# Patient Record
Sex: Male | Born: 1940 | Race: White
Health system: Northeastern US, Academic
[De-identification: ages and names within clinical notes are randomized; demographics above are authoritative.]

## PROBLEM LIST (undated history)

## (undated) NOTE — Progress Notes (Signed)
Formatting of this note might be different from the original.    HPI    ROS    Physical Exam    d  Electronically signed by Kurtis Bushman, DO at 01/20/2013  9:28 PM EDT

---

## 2005-06-05 ENCOUNTER — Encounter: Payer: Self-pay | Admitting: Cardiology

## 2005-06-05 ENCOUNTER — Other Ambulatory Visit: Payer: Self-pay

## 2005-07-02 ENCOUNTER — Encounter: Payer: Self-pay | Admitting: Cardiology

## 2005-07-03 ENCOUNTER — Encounter: Payer: Self-pay | Admitting: Cardiology

## 2005-07-17 ENCOUNTER — Other Ambulatory Visit: Payer: Self-pay

## 2005-07-17 ENCOUNTER — Encounter: Payer: Self-pay | Admitting: Cardiology

## 2005-08-14 ENCOUNTER — Encounter: Payer: Self-pay | Admitting: Cardiology

## 2005-10-02 ENCOUNTER — Encounter: Payer: Self-pay | Admitting: Cardiology

## 2005-10-02 ENCOUNTER — Other Ambulatory Visit: Payer: Self-pay

## 2006-04-10 DIAGNOSIS — R5381 Other malaise: Secondary | ICD-10-CM | POA: Insufficient documentation

## 2006-04-10 DIAGNOSIS — K219 Gastro-esophageal reflux disease without esophagitis: Secondary | ICD-10-CM | POA: Insufficient documentation

## 2006-04-10 DIAGNOSIS — I4891 Unspecified atrial fibrillation: Secondary | ICD-10-CM | POA: Insufficient documentation

## 2006-04-10 DIAGNOSIS — I10 Essential (primary) hypertension: Secondary | ICD-10-CM | POA: Insufficient documentation

## 2006-04-10 DIAGNOSIS — R5383 Other fatigue: Secondary | ICD-10-CM | POA: Insufficient documentation

## 2006-04-16 ENCOUNTER — Encounter: Payer: Self-pay | Admitting: Cardiology

## 2006-04-16 ENCOUNTER — Other Ambulatory Visit: Payer: Self-pay

## 2007-04-15 ENCOUNTER — Other Ambulatory Visit: Payer: Self-pay

## 2007-04-15 ENCOUNTER — Encounter: Payer: Self-pay | Admitting: Cardiology

## 2011-08-07 IMAGING — DX ABDOMEN 1 VIEW
1 series · 1 of 1 positions shown · non-contrast
Comparison: none

KUB, 08/07/11:
INDICATION: History of right nephrolithiasis.

[AP]
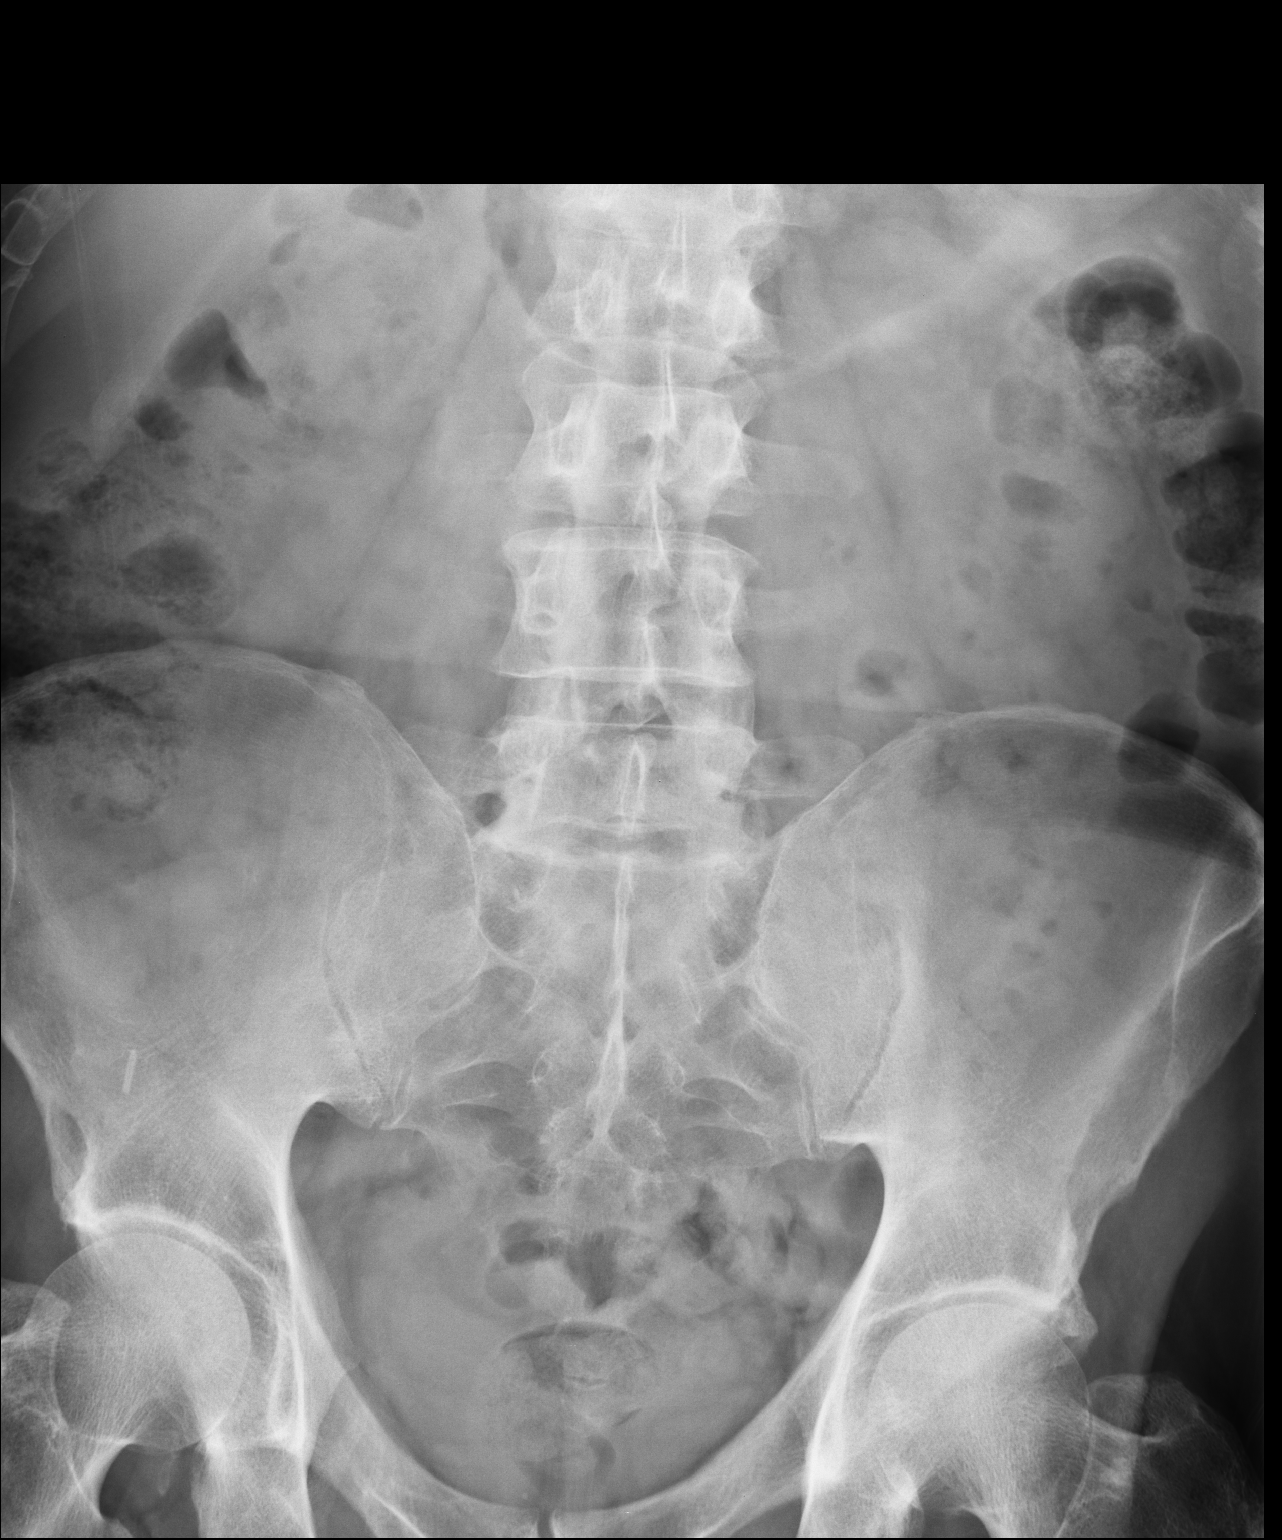

[1 of 1 positions shown; findings below may reference images not displayed]

FINDINGS: There are surgical clips overlying the right upper quadrant and
the right lower quadrants of the abdomen, most consistent with a previous
cholecystectomy and appendectomy.  No evidence of calcifications overlying
the kidneys, ureters, or bladder are identified.  No evidence of an acute
bone or soft tissue abnormality is apparent.
IMPRESSION: Probable prior cholecystectomy and probable previous appendectomy.  No
nephroureterolithiasis or other urinary abnormality is detected on this
plain film exam.

## 2012-09-02 IMAGING — DX ABDOMEN 1 VIEW
1 series · 1 of 1 positions shown · non-contrast
Comparison: none

ABDOMEN, 09/02/12:
A supine view of the abdomen from 09/02/12 was obtained in this 71-year-old
with gross hematuria.  Comparison is made to a film from 08/07/11.
There are surgical clips from a cholecystectomy.  No abnormal
calcifications seen in either kidney or ureter.  There is a surgical clip
in the right lower quadrant probably from appendectomy.  Mild degenerative
changes are seen in both hips and in the lumbar spine.

[AP]
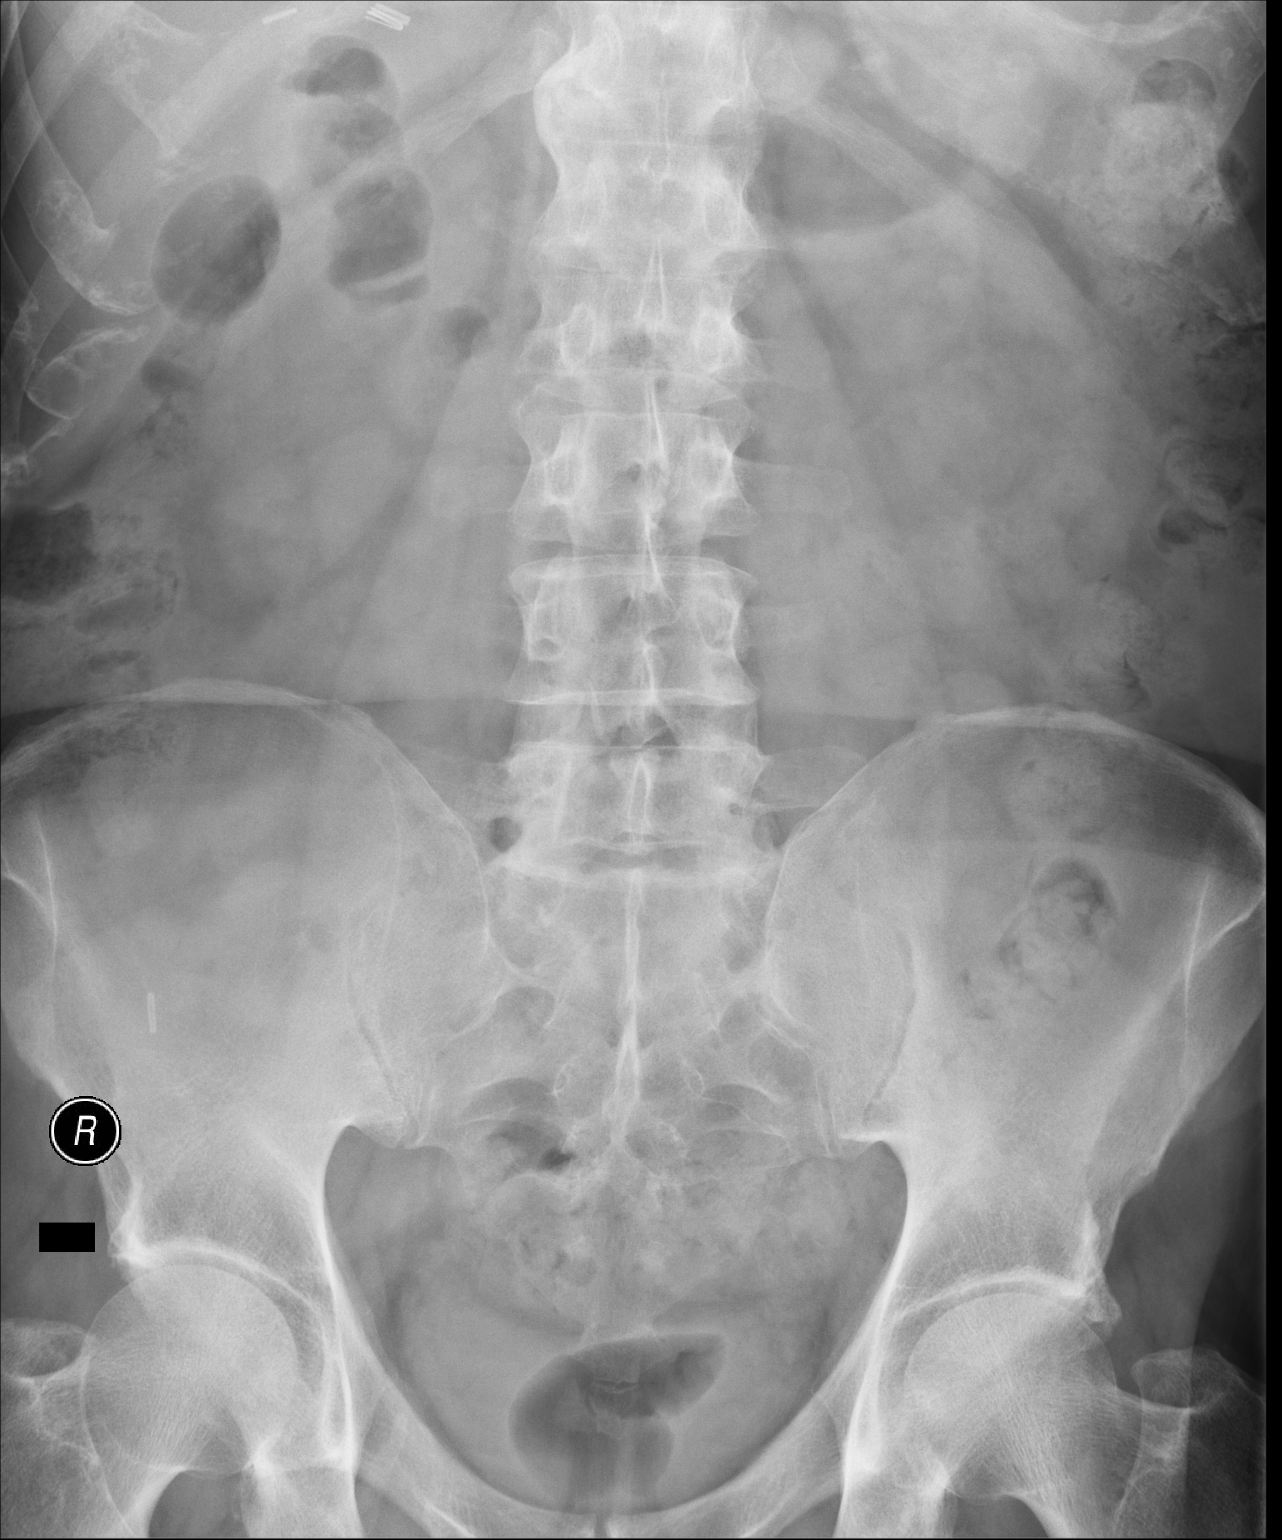

[1 of 1 positions shown; findings below may reference images not displayed]

IMPRESSION: No calcified renal stone identified.

## 2020-11-22 IMAGING — CT CT ENTEROGRAPHY WITH CONTRAST
2 series · 14 of 36 positions shown, 18 images · IV contrast (ISOVUE 300)
Comparison: None

CT ENTEROGRAPHY WITH CONTRAST, 11/22/2020 [DATE]: 
CLINICAL INDICATION:  Diarrhea and cramping. Low abdominal pain. Radiates to 
back for 3 months. Previous cholecystectomy. 
The patient's eGFR was calculated to be 64.2 using the i-STAT device. 
A search for DICOM formatted images was conducted for prior CT imaging studies 
completed at a non-affiliated media free facility.
TECHNIQUE: The region of interest was scanned with contrast on a high 
resolution low dose CT scanner.  100 mL of Isovue 300 were injected 
intravenously. The patient also drank approximately 9222 mL of oral contrast. 1 
mg glucagon was injected intravenously.  Routine MPR reconstructions were 
performed.

[Series 4: abd/pel ax w · axial · 0.79mm/px · z∈[-480,-18]mm · 13 of 171 slices shown, 16 images]
[im 11/171  soft-tissue]
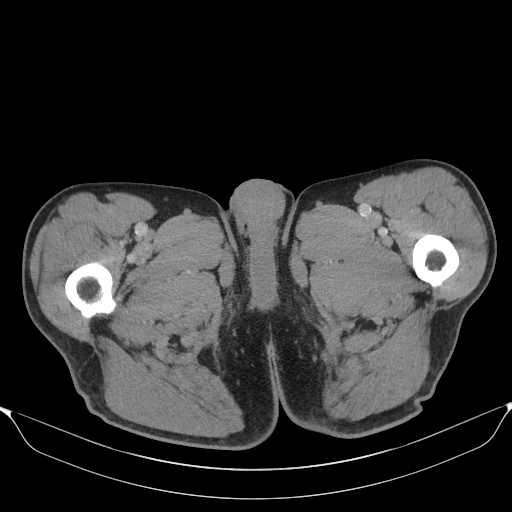
[im 11/171  bone]
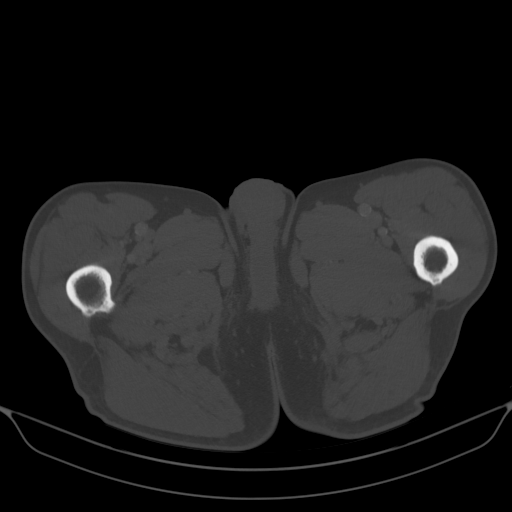
[im 28/171  soft-tissue]
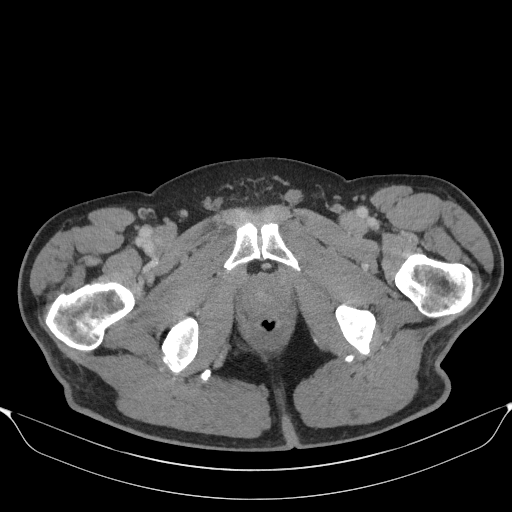
[im 44/171  soft-tissue]
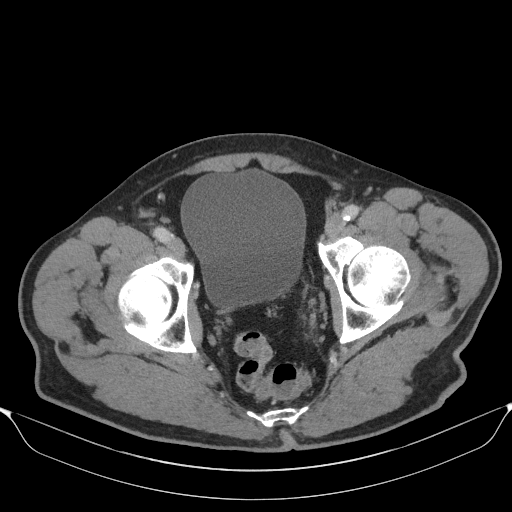
[im 61/171  soft-tissue]
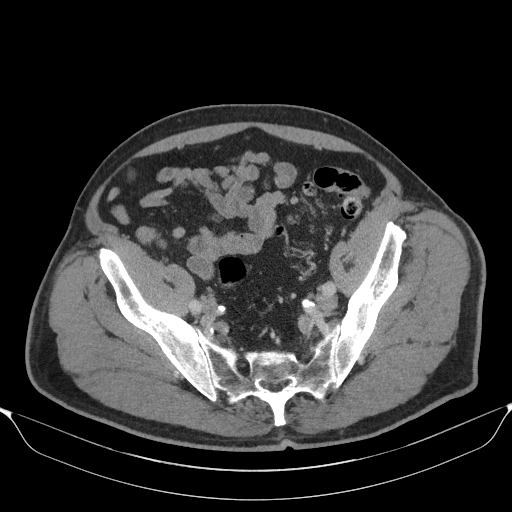
[im 77/171  soft-tissue]
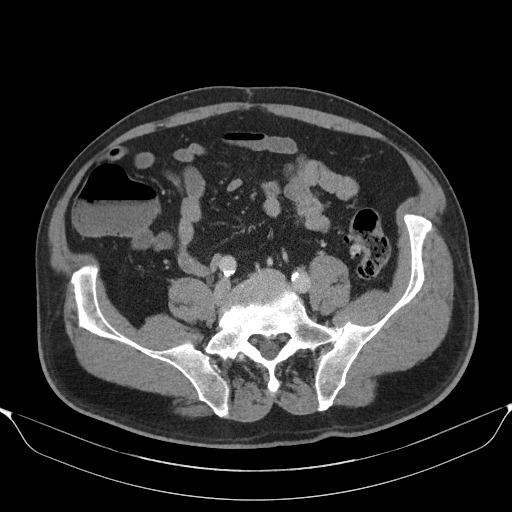
[im 94/171  soft-tissue]
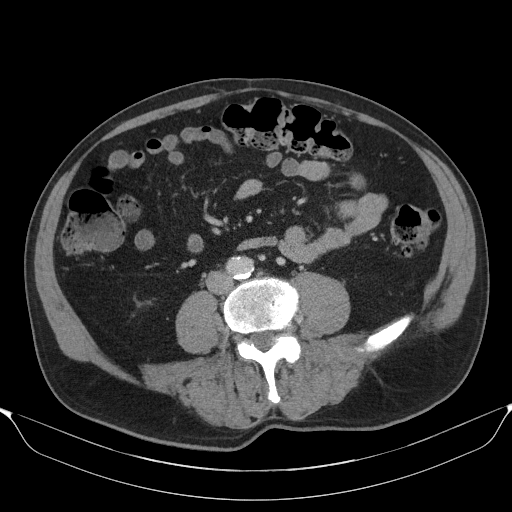
[im 110/171  soft-tissue]
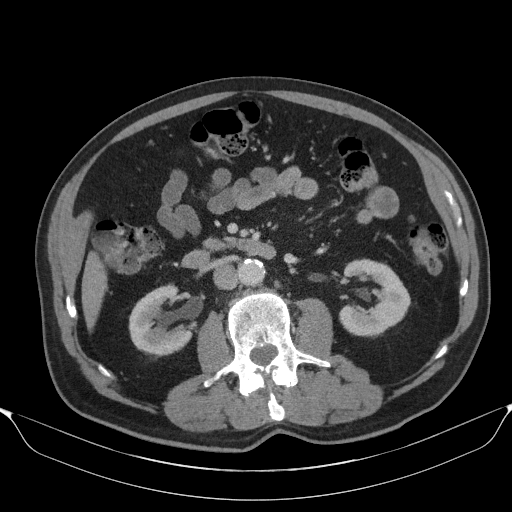
[im 127/171  soft-tissue]
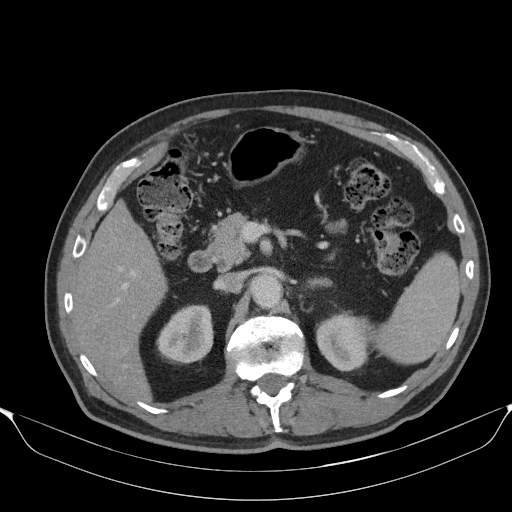
[im 143/171  soft-tissue]
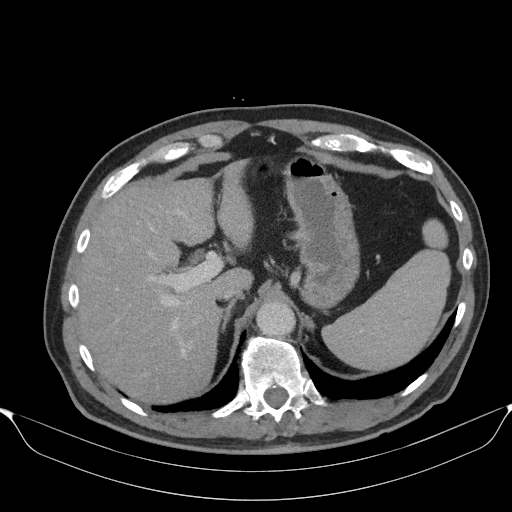
[im 143/171  bone]
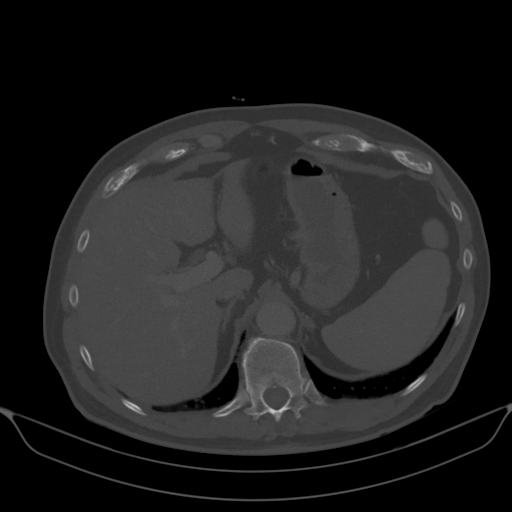
[im 149/171  lung]
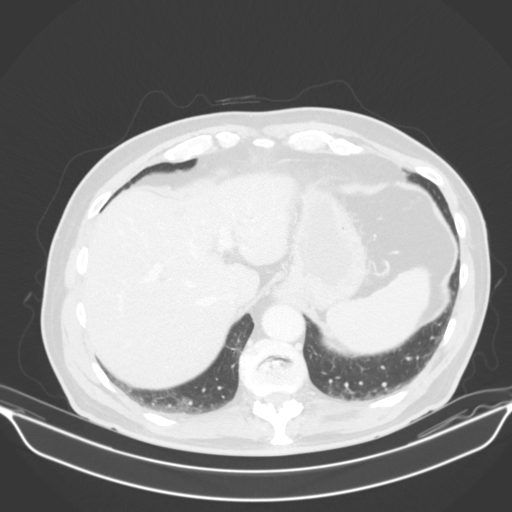
[im 154/171  lung]
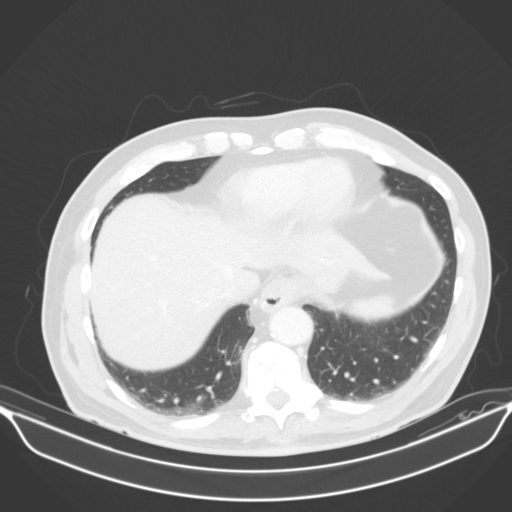
[im 160/171  soft-tissue]
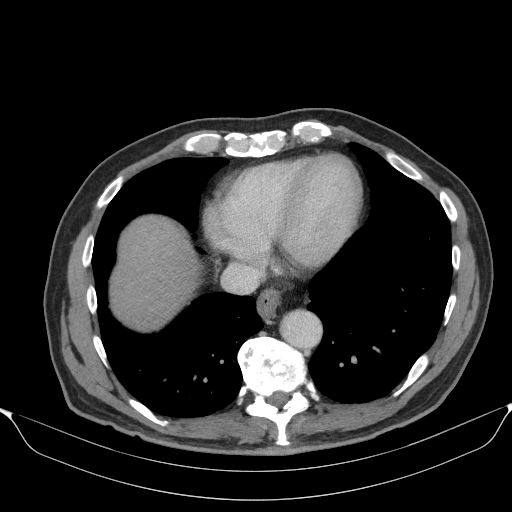
[im 160/171  lung]
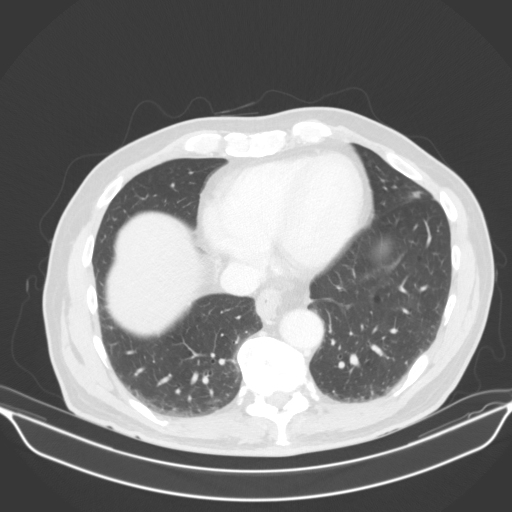
[im 165/171  lung]
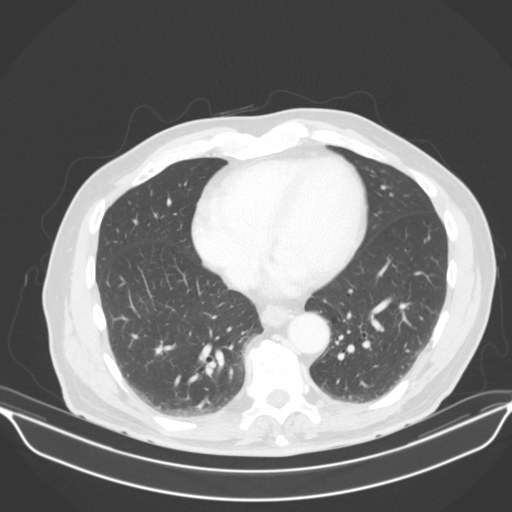

[Series 6: abd/pel sag w · sagittal · 0.70mm/px · 1 of 126 slices shown, 2 images]
[im 42/126  soft-tissue]
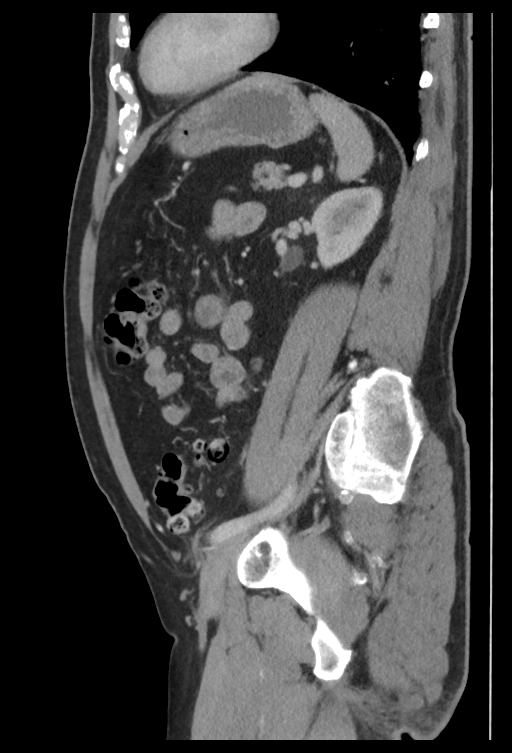
[im 42/126  bone]
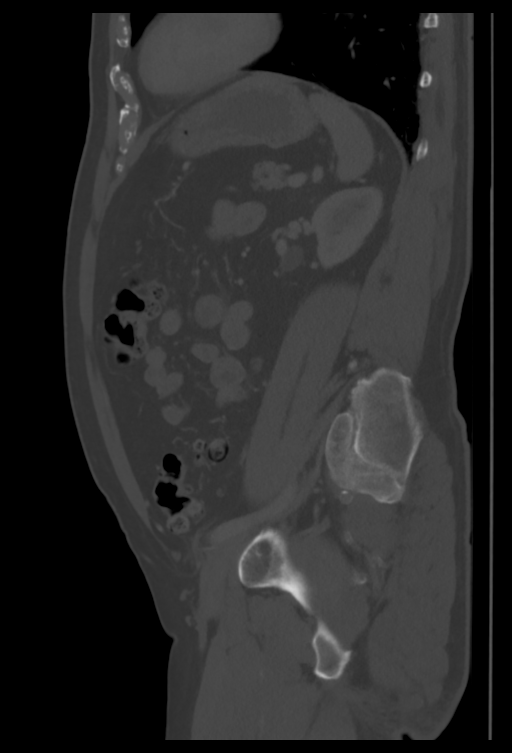

[14 of 36 positions shown; findings below may reference images not displayed]

FINDINGS: LUNG BASES: Lung bases are clear. No pleural effusions. 
HEPATOBILIARY: No mass or biliary dilatation. Previous cholecystectomy. 
SPLEEN: Normal in size. 
PANCREAS: No ductal dilatation or mass. 
ADRENALS: No mass. 
GENITOURINARY: No enhancing mass or hydronephrosis.  Bladder is unremarkable. 
LYMPH NODES: No adenopathy. 
STOMACH, SMALL BOWEL AND COLON: No bowel wall thickening or obstruction. 
VASCULAR STRUCTURES: Atherosclerotic changes without aneurysmal dilatation. 
MUSCULOSKELETAL: No acute osseous abnormality. Scattered degenerative changes. 
ADDITIONAL FINDINGS: Enlarged prostate gland.
IMPRESSION: No acute abnormality seen to explain the patient's change in bowel habits. No 
mass or acute abnormality seen within the colon. 
RADIATION DOSE REDUCTION: All CT scans are performed using radiation dose 
reduction techniques, when applicable.  Technical factors are evaluated and 
adjusted to ensure appropriate moderation of exposure.  Automated dose 
management technology is applied to adjust the radiation doses to minimize 
exposure while achieving diagnostic quality images.

## 2021-05-23 IMAGING — CT CT CHEST WITH CONTRAST
2 of 4 series · 15 of 36 positions shown, 18 images · IV contrast (ISOVUE 300)
Comparison: Comparison was made to the prior exam(s) within the last 12 months 
CT scan of the abdomen and pelvis from November 2020.

________________________________________________________________________________________________ 
CT CHEST WITH CONTRAST, 05/23/2021 [DATE]: 
CLINICAL INDICATION: Elevated tumor markers. Previous cholecystectomy. 
A search for DICOM formatted images was conducted for prior CT imaging studies 
completed at a non-affiliated media free facility.
TECHNIQUE: The chest was scanned from base of neck through the lung bases with 
100 mL of Isovue 300 injected intravenously on a high resolution low dose CT 
scanner. Routine MPR and MIP 3D renderings were reconstructed on an independent 
workstation with concurrent physician supervision. The patients eGFR was 
calculated to be 71.7 mL/min/1.73 m2 using the i-STAT device.

[Series 4: chest 2.0 i31s 3 · axial · 0.90mm/px · z∈[-339,+17]mm · 12 of 196 slices shown, 15 images]
[im 9/196  mediastinal]
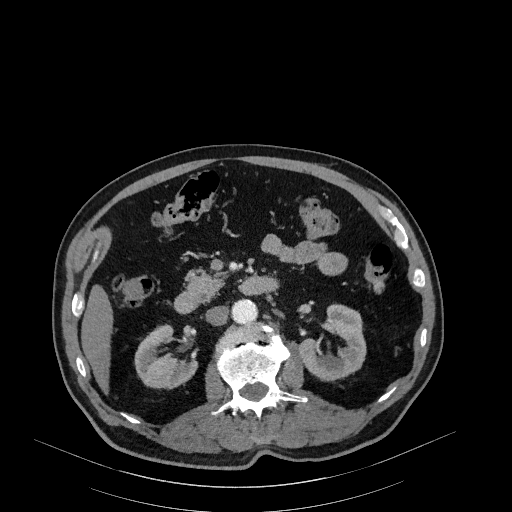
[im 9/196  lung]
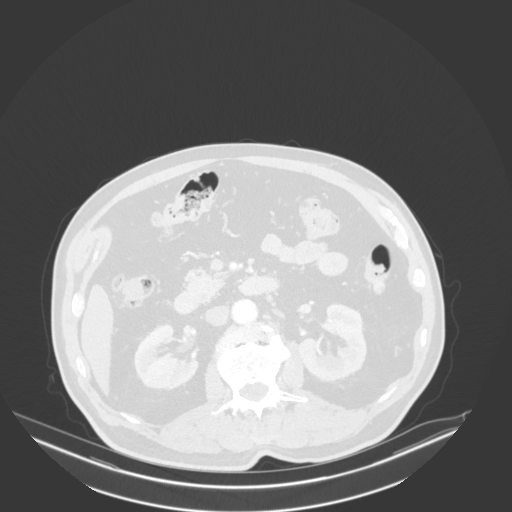
[im 27/196  lung]
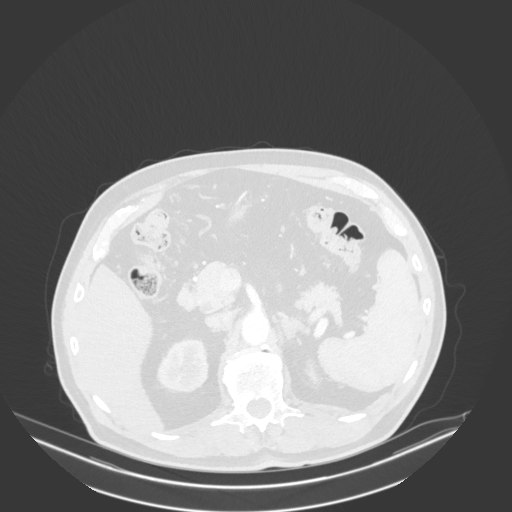
[im 45/196  lung]
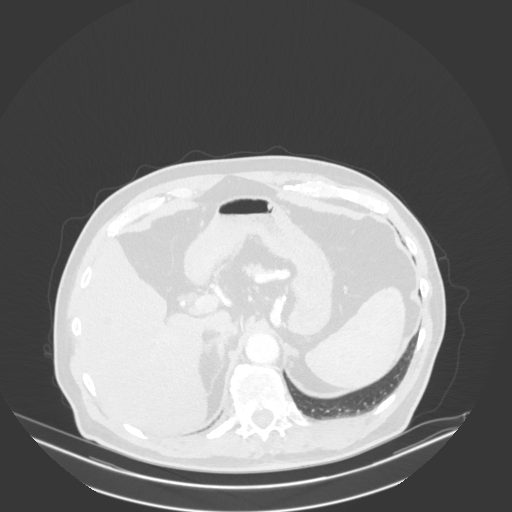
[im 63/196  lung]
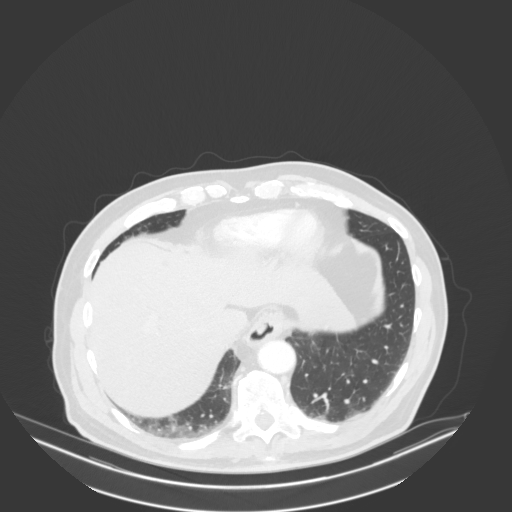
[im 71/196  mediastinal]
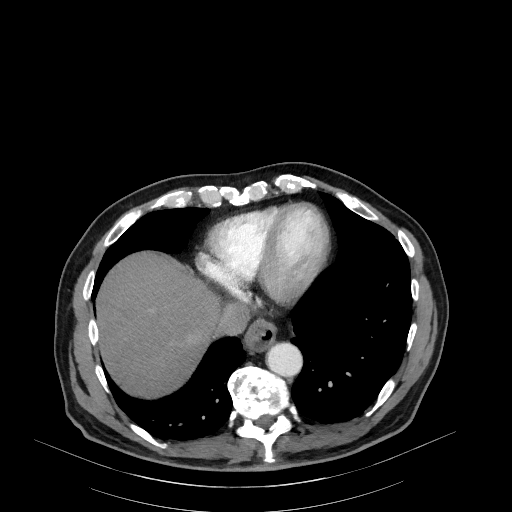
[im 71/196  lung]
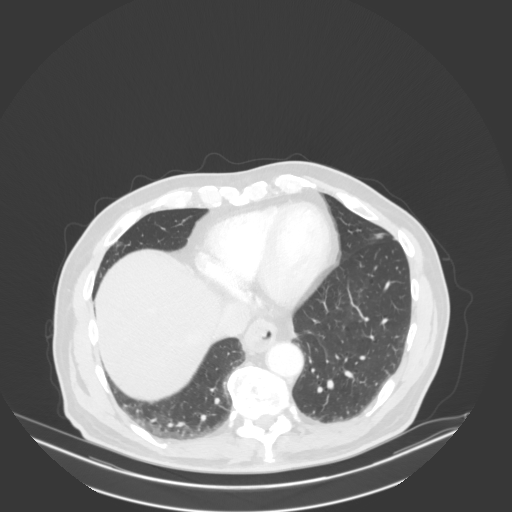
[im 89/196  lung]
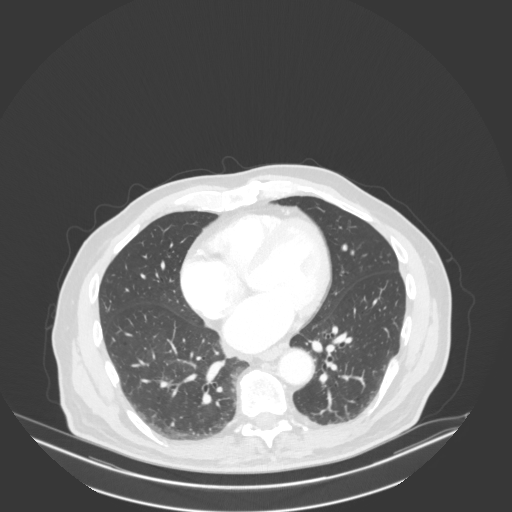
[im 107/196  lung]
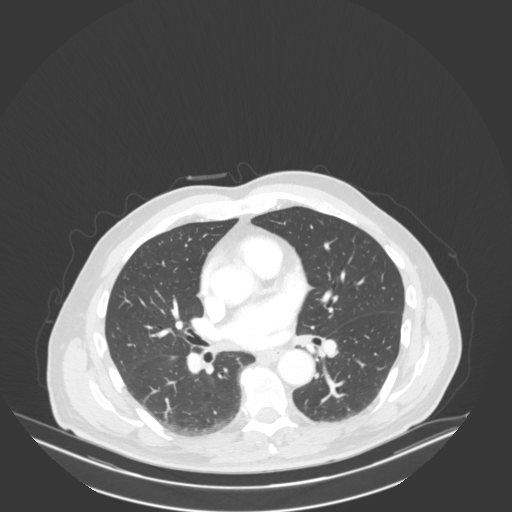
[im 125/196  lung]
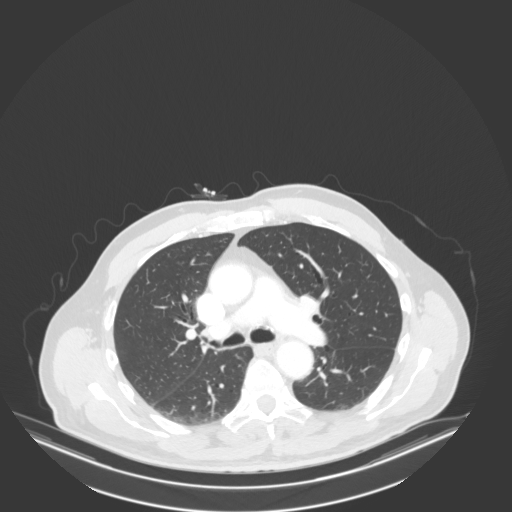
[im 133/196  mediastinal]
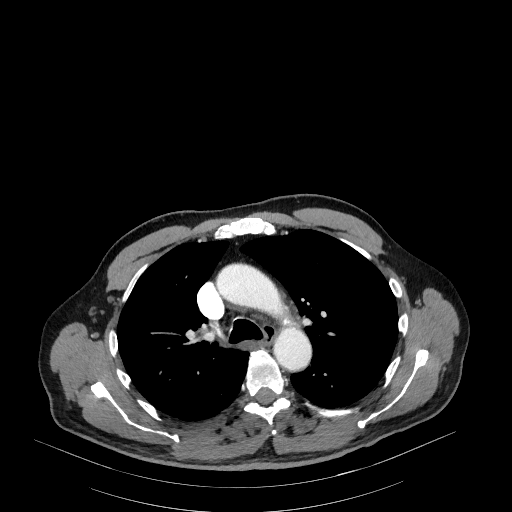
[im 133/196  lung]
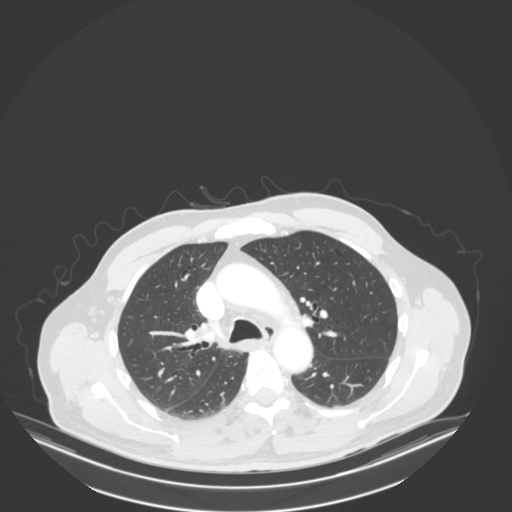
[im 151/196  lung]
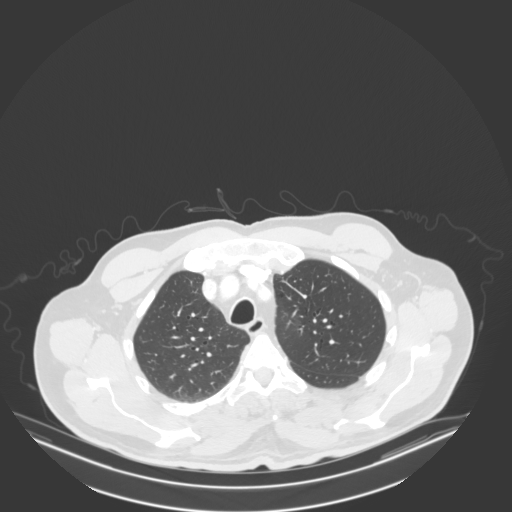
[im 169/196  lung]
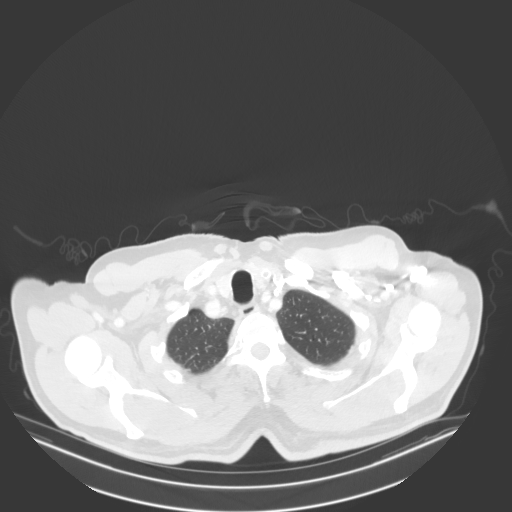
[im 187/196  lung]
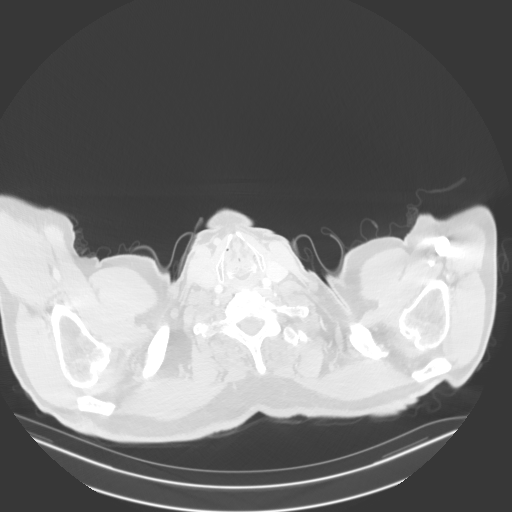

[Series 7: coronal · coronal · 0.77mm/px · 3 of 151 slices shown]
[im 31/151  lung]
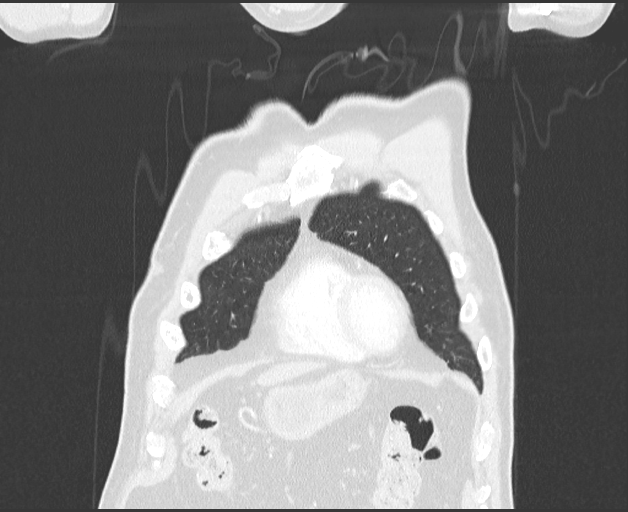
[im 61/151  lung]
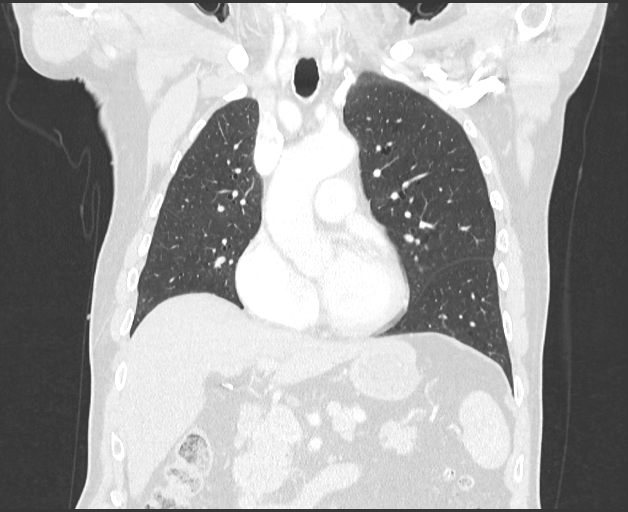
[im 91/151  lung]
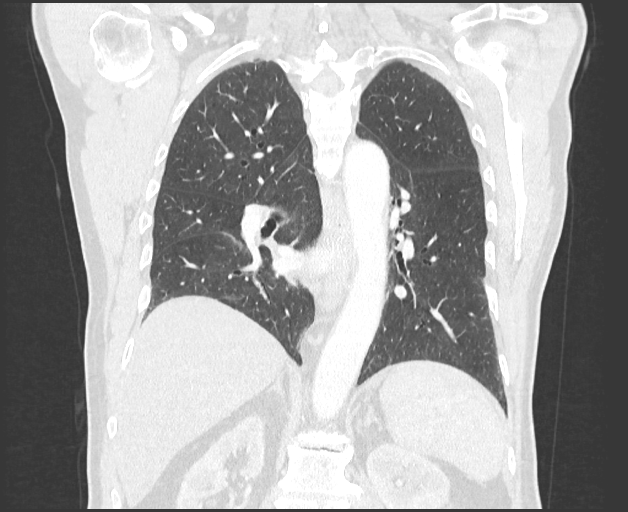

[15 of 36 positions shown; findings below may reference images not displayed]

FINDINGS: LUNGS AND PLEURA:  Mild emphysematous changes. No mass. No pleural effusion. No 
consolidation. 
MEDIASTINUM:  Atherosclerotic changes. At least mild coronary calcifications. 
CHEST WALL/AXILLA: No mass or adenopathy.  
UPPER ABDOMEN: Small hiatal hernia. 
MUSCULOSKELETAL: Degenerative changes. No acute fracture seen.
IMPRESSION: Emphysematous changes, atherosclerotic changes and degenerative changes. Small 
hiatal hernia. No mass or suspicious adenopathy identified. 
RADIATION DOSE REDUCTION: All CT scans are performed using radiation dose 
reduction techniques, when applicable.  Technical factors are evaluated and 
adjusted to ensure appropriate moderation of exposure.  Automated dose 
management technology is applied to adjust the radiation doses to minimize 
exposure while achieving diagnostic quality images.

## 2022-11-21 IMAGING — MR MRI LUMBAR SPINE W/WO CONTRAST
6 of 12 series · 12 of 48 positions shown · IV contrast (Gadolinium)
Comparison: None

________________________________________________________________________________________________ 
MRI LUMBAR SPINE W/WO CONTRAST, 11/21/2022 [DATE]: 
CLINICAL INDICATION: Low back pain. Laminectomy one month ago. Bilateral thigh 
numbness and tingling.
TECHNIQUE: Multiplanar, multiecho position MR images of the lumbar spine were 
performed without and with 8.5 mL of Gadavist were injected intravenously by 
hand. 1.5 mL was discarded. Patient was scanned on a 1.5T magnet

[Series 101: survey · axial · 10.0mm · 1.25mm/px · 1 of 10 slices shown]
[im 1/10]
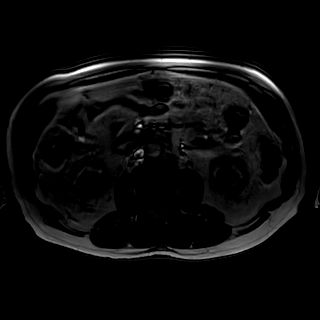

[Series 201: t2w_cor-surv · coronal · 6.0mm · 0.62mm/px · 1 of 10 slices shown]
[im 1/10]
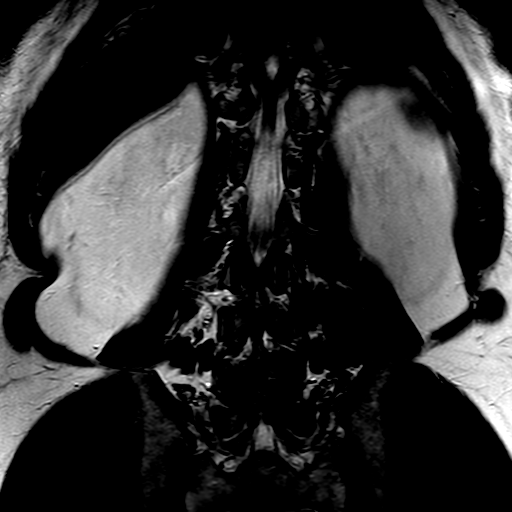

[Series 301: t1_tse_sag · sagittal · 4.0mm · 0.36mm/px · 2 of 19 slices shown]
[im 1/19]
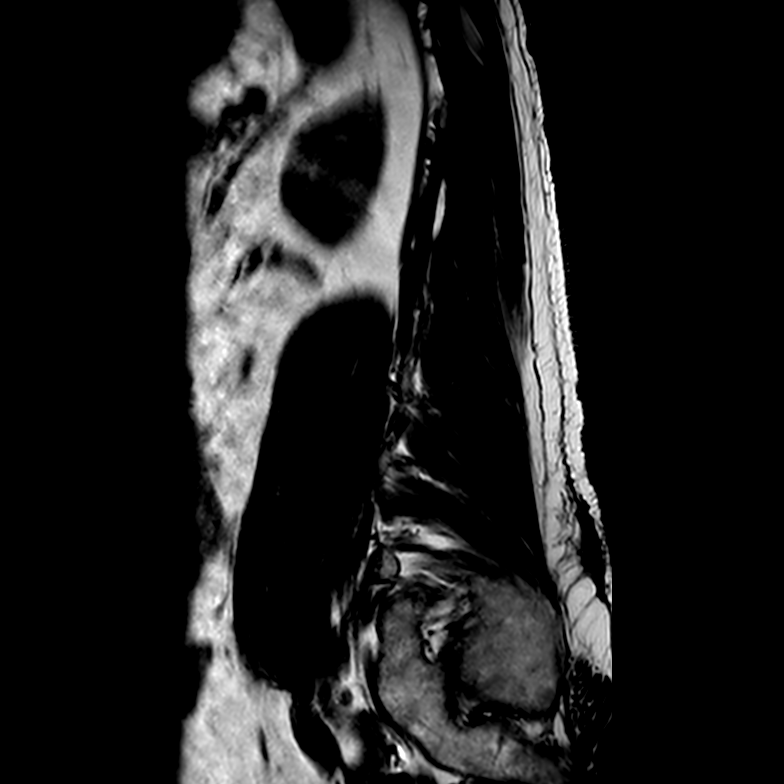
[im 19/19]
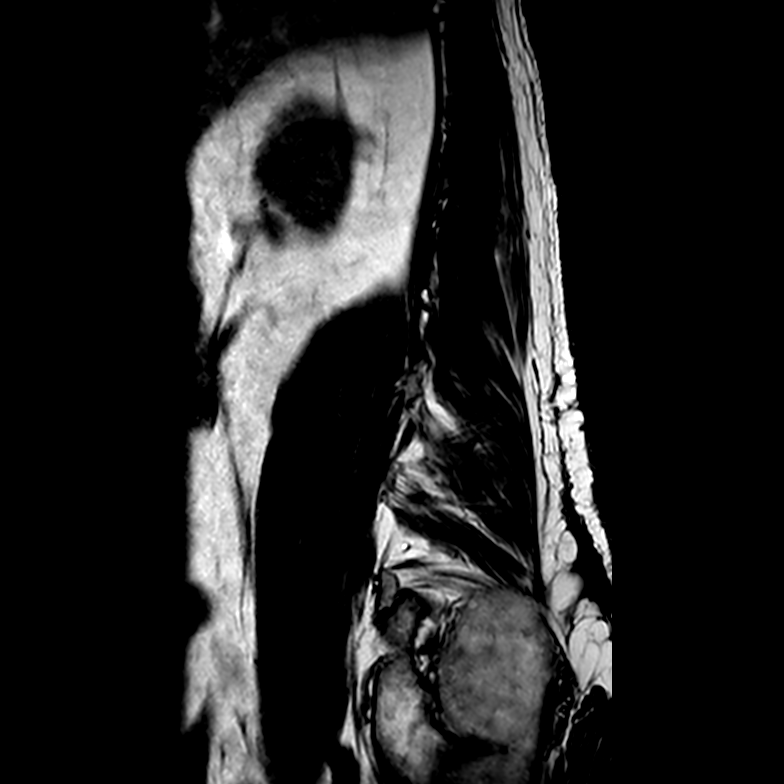

[Series 402: (id)_mdixon_tse · sagittal · 4.0mm · 0.43mm/px · 2 of 19 slices shown]
[im 1/19]
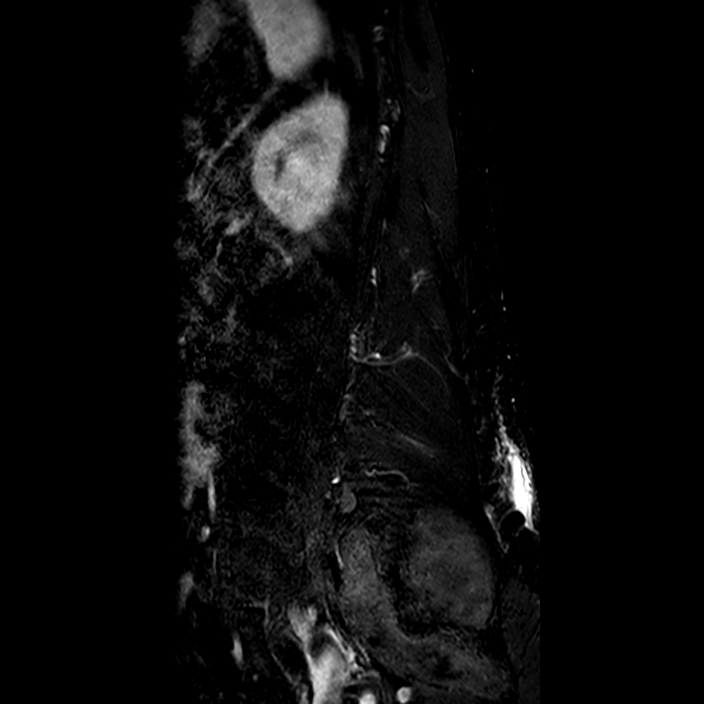
[im 19/19]
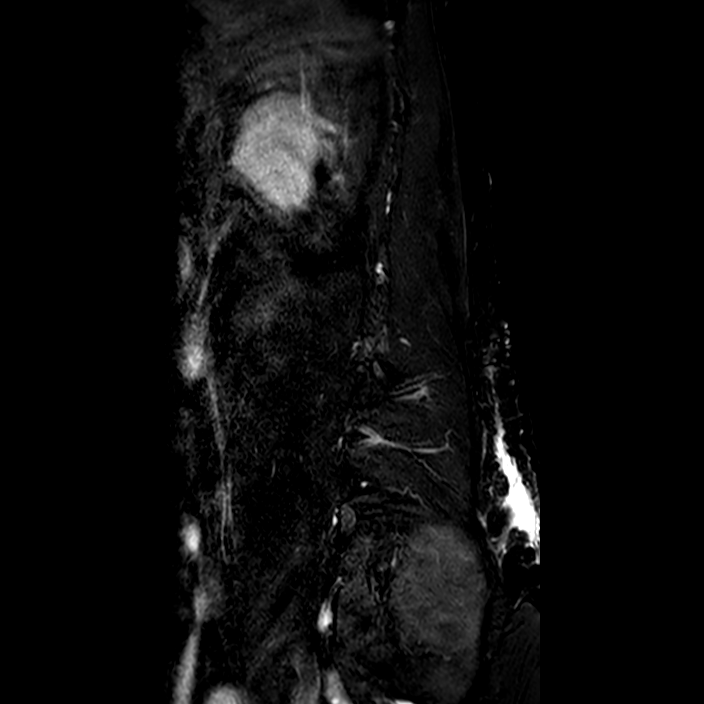

[Series 403: st2w_mdixon_tse · sagittal · 4.0mm · 0.43mm/px · 2 of 19 slices shown]
[im 1/19]
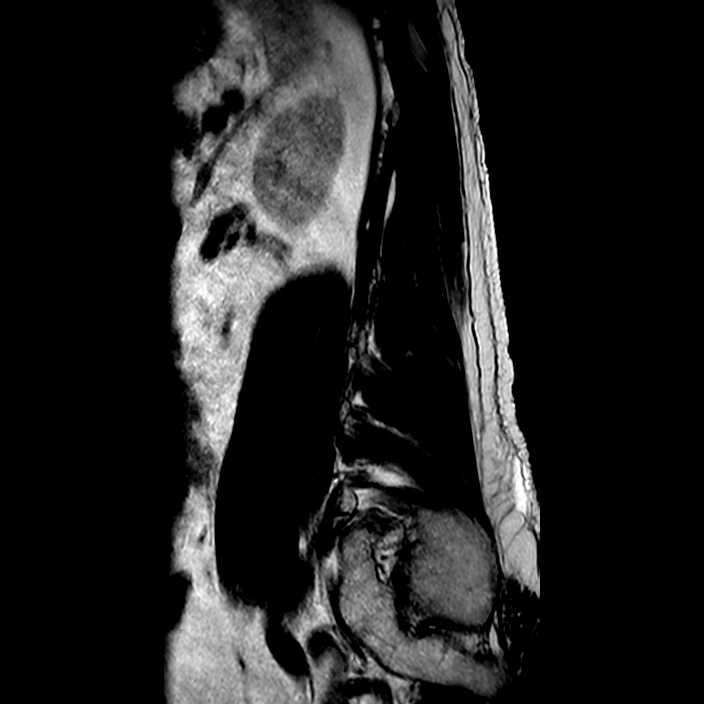
[im 19/19]
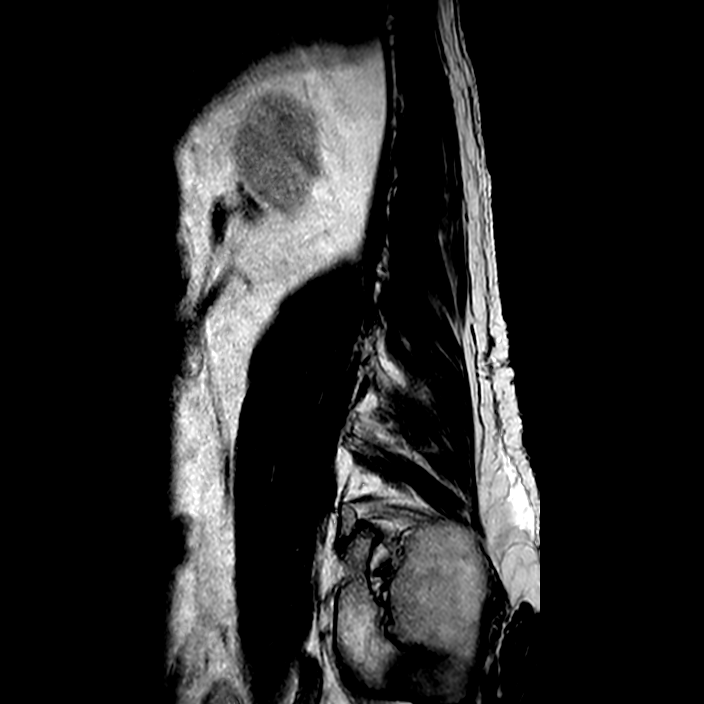

[Series 601: t2_(person_name)_(person_name)_mst · axial · 4.0mm · 0.30mm/px · z∈[-161,+77]mm · 4 of 30 slices shown]
[im 1/30]
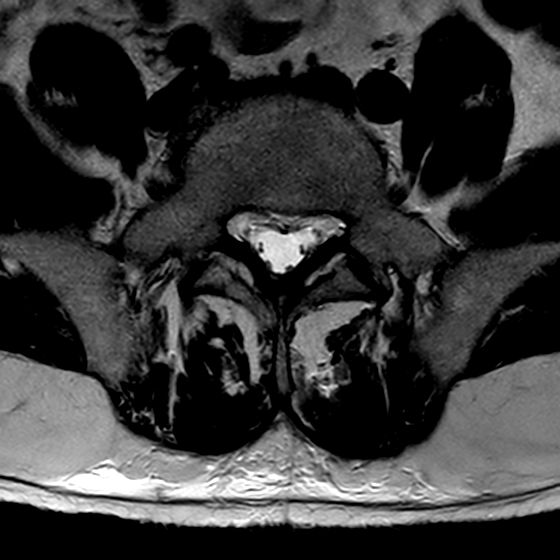
[im 10/30]
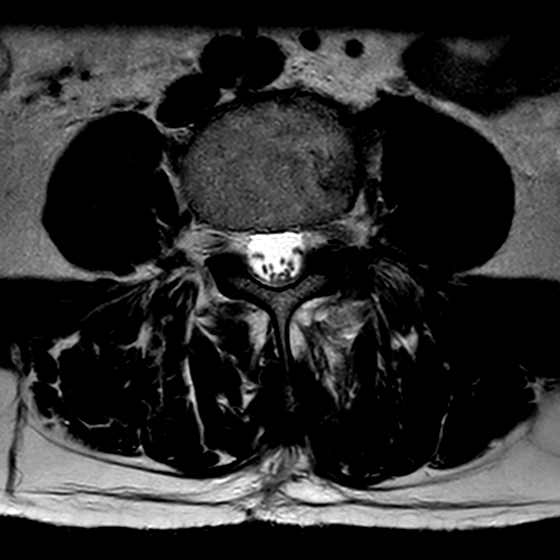
[im 20/30]
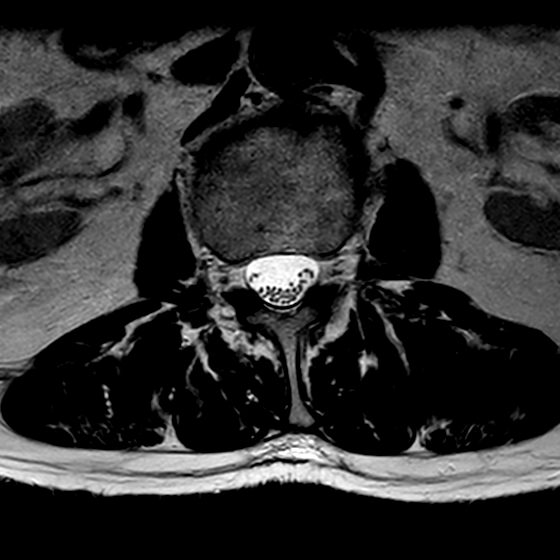
[im 30/30]
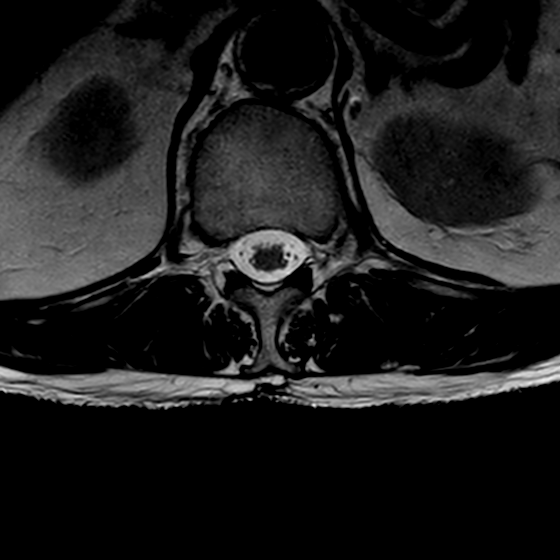

[12 of 48 positions shown; findings below may reference images not displayed]

FINDINGS: Laminectomy on the left at L4-L5. There is enhancing granulation tissue within 
the laminectomy bed but without evidence of discitis, osteomyelitis, or epidural 
abscess. There is a small fluid collection within the subcutaneous fat at the L4 
level measuring 4 x 16 mm in transverse dimension and 6 mm in craniocaudal 
dimension. It demonstrates peripheral enhancement. No communication to the 
thecal sac. 
-------------------------------------------------------------------------------- 
------ 
GENERAL: 
Nomenclature is based on 5 lumbar type vertebral bodies.     
ALIGNMENT: Mild dextroconvex lumbar scoliosis. Trace grade 1 retrolisthesis L1 
on L2 and L2 on L3 and L3 on L4 and L5 on S1. 
VERTEBRAL BODY HEIGHT: There is minimal chronic appearing anterior wedging T12 
and L1, likely degenerative.  
MARROW SIGNAL: No focal suspect signal abnormality. 
CORD SIGNAL: Normal distal spinal cord and cauda equina. Conus medullaris 
terminates at L1. 
ADDITIONAL FINDINGS: Colonic diverticulosis. 
Modic I-II: L1-L2, L2-L3, L4-L5, L5-S1. 
Ligamentum Flavum > 2.5 mm: All except the laminectomy site. 
-------------------------------------------------------------------------------- 
------ 
SEGMENTAL: 
T12-L1: Moderate loss of disc height. There is evidence of mineralization of the 
disc. Minimal annular bulge. Otherwise normal. 
L1-L2: Mild loss of disc height and signal. Trace retrolisthesis. Canal and 
foramina are patent. Small bilateral facet joint effusions. 
L2-L3: Mild loss of disc height and signal. Anterior osteophytes. Schmorls 
nodes. Retrolisthesis. Canal and foramina are patent. Small bilateral facet 
joint effusions. 
L3-L4: Schmorls node. Loss of disc signal. Mild diffuse annular bulge with mild 
canal stenosis. Slight narrowing lateral recesses bilaterally. Small bilateral 
facet joint effusions. Patent left foramen. Mild right foraminal narrowing. 
L4-L5: Anterior annular fissure. Mild loss of disc height and signal. Mild 
diffuse annular bulge with borderline canal stenosis and slight narrowing left 
lateral recess. Right-sided ligamentum flavum hypertrophy. Right-sided facet 
arthropathy. The foramina are mild to moderately narrowed bilaterally. 
L5-S1: Loss of disc signal. Mild annular bulge with 2 mm AP diameter central 
disc protrusion. No effective central canal narrowing. Mild bilateral foraminal 
narrowing. Facet arthropathy. 
-------------------------------------------------------------------------------- 
------
IMPRESSION: Laminectomy L4-L5. Small fluid collection in the subcutaneous fat posteriorly at 
the L4 level without communication to the thecal sac. There is thin peripheral 
enhancement of this collection. Stability uncertain. Borderline canal stenosis 
with slight narrowing left lateral recess at this level. Mild to moderate 
bilateral foraminal narrowing. 
Other, less significant lumbar degenerative changes detailed above. 
Exam was reviewed and read in conjunction with Dr. Dennys Jair.

## 2023-09-29 IMAGING — MR MRI BRAIN WITHOUT CONTRAST
8 of 12 series · 34 of 48 positions shown · IV contrast (gadolinium)
Comparison: MRI lumbar spine November 21, 2022. Brain MRI November 30, 2019.

________________________________________________________________________________________________ 
MRI BRAIN WITHOUT CONTRAST, 09/29/2023 [DATE]: 
CLINICAL INDICATION: Other Amnesia , memory difficulty.
TECHNIQUE: Multiplanar, multiecho position MR images of the brain were performed 
without intravenous gadolinium enhancement. Patient was scanned on a 3T magnet.

[Series 101: survey · axial · 10.0mm · 0.98mm/px · 1 of 5 slices shown (1 of 2)]
[im 1/5]
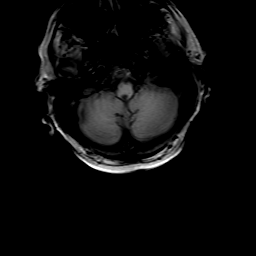

[Series 201: survey · axial · 10.0mm · 0.98mm/px · 1 of 5 slices shown (2 of 2)]
[im 1/5]
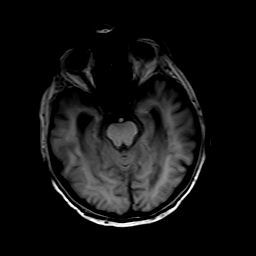

[Series 303: dadc map · axial · 4.0mm · 1.07mm/px · z∈[-48,+117]mm · 3 of 34 slices shown (1 of 2)]
[im 1/34]
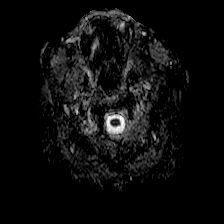
[im 17/34]
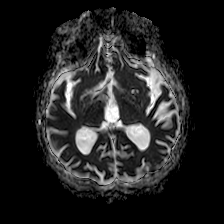
[im 34/34]
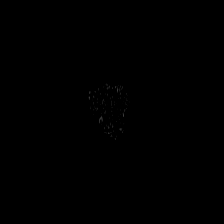

[Series 304: isob (id) · axial · 4.0mm · 1.07mm/px · z∈[-48,+117]mm · 3 of 32 slices shown]
[im 1/32]
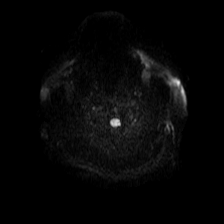
[im 16/32]
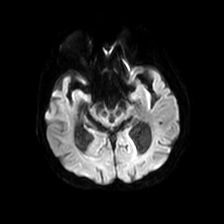
[im 32/32]
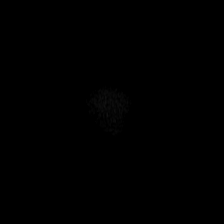

[Series 403: dadc map · coronal · 4.0mm · 0.81mm/px · 4 of 40 slices shown (2 of 2)]
[im 1/40]
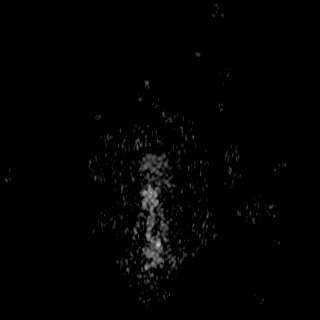
[im 14/40]
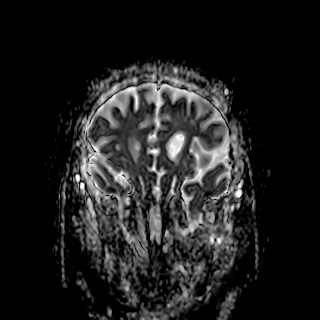
[im 27/40]
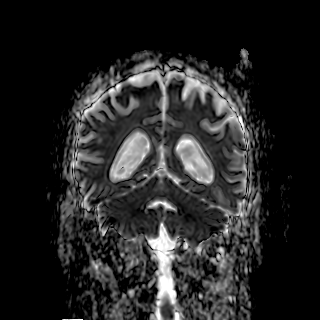
[im 40/40]
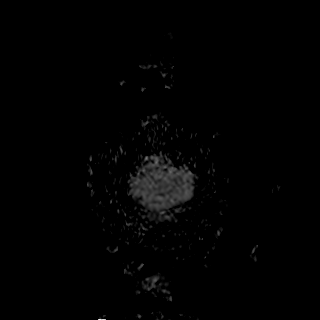

[Series 601: FLAIR fat-sat · axial · 5.0mm · 0.60mm/px · z∈[-51,+117]mm · 3 of 29 slices shown]
[im 1/29]
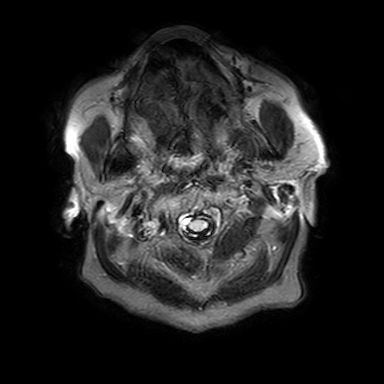
[im 15/29]
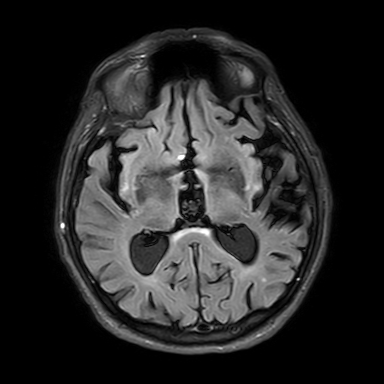
[im 29/29]
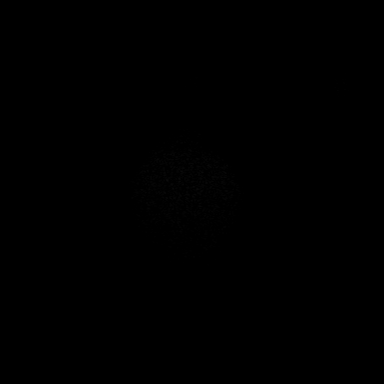

[Series 701: SWI · axial · 3.0mm · 0.53mm/px · z∈[-49,+119]mm · 11 of 113 slices shown (1 of 2)]
[im 1/113]
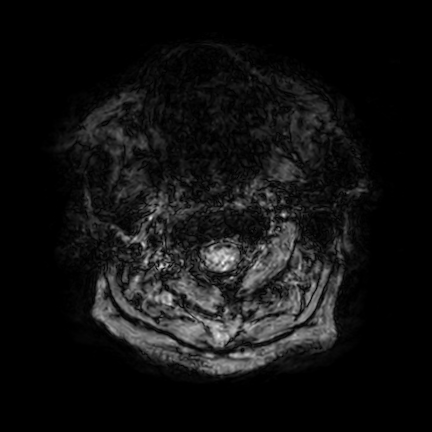
[im 12/113]
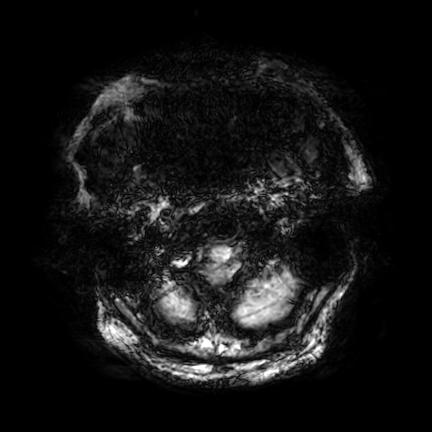
[im 23/113]
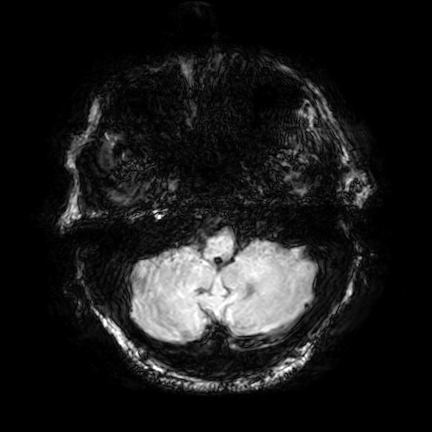
[im 34/113]
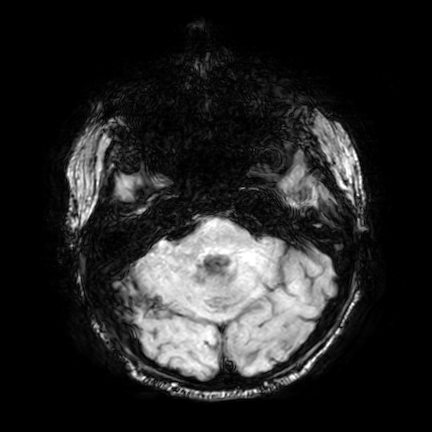
[im 45/113]
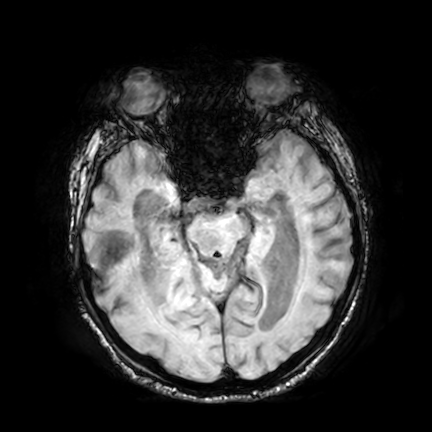
[im 57/113]
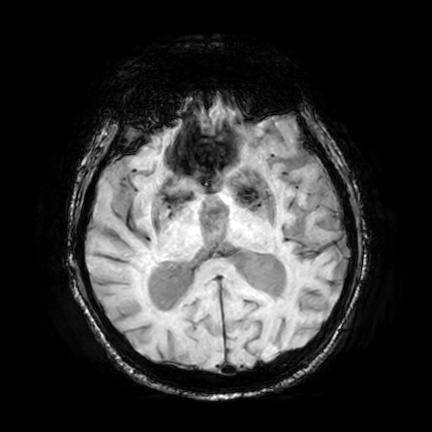
[im 68/113]
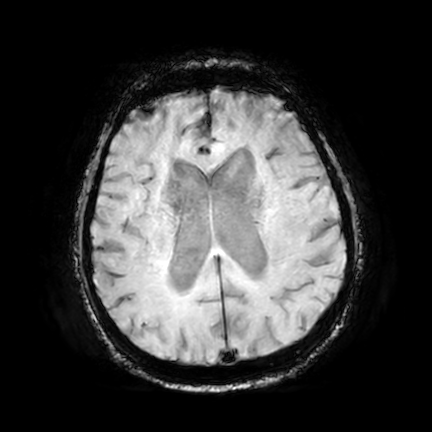
[im 79/113]
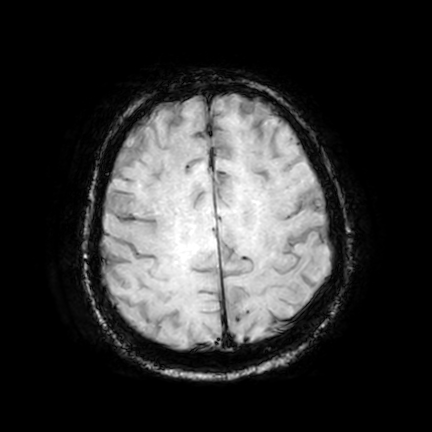
[im 90/113]
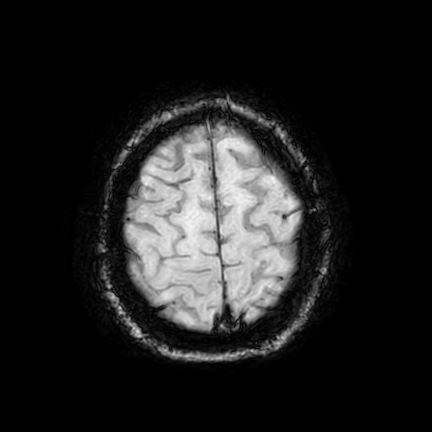
[im 101/113]
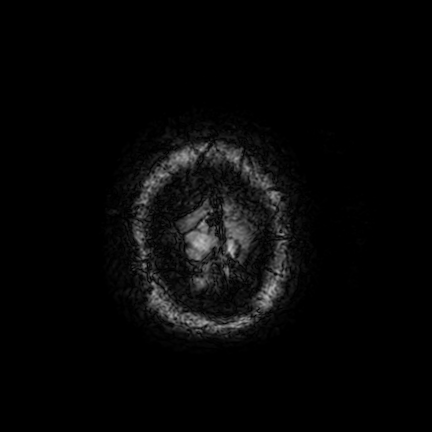
[im 113/113]
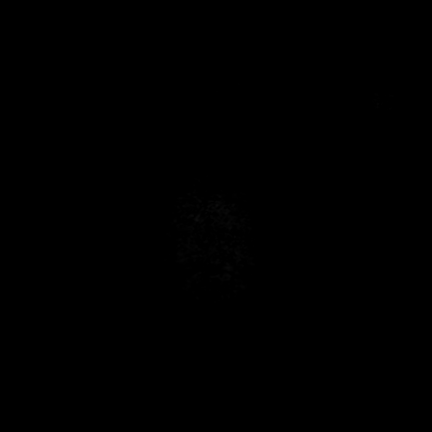

[Series 702: SWI · axial · 10.0mm · 0.53mm/px · z∈[-40,+118]mm · 8 of 80 slices shown (2 of 2)]
[im 1/80]
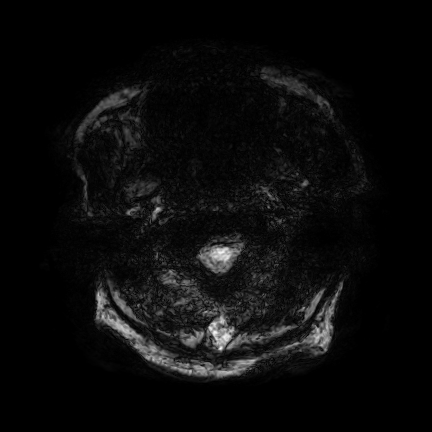
[im 12/80]
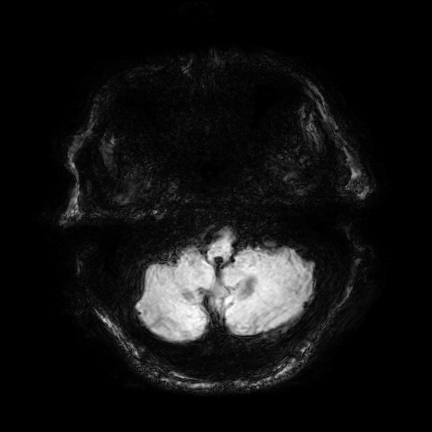
[im 23/80]
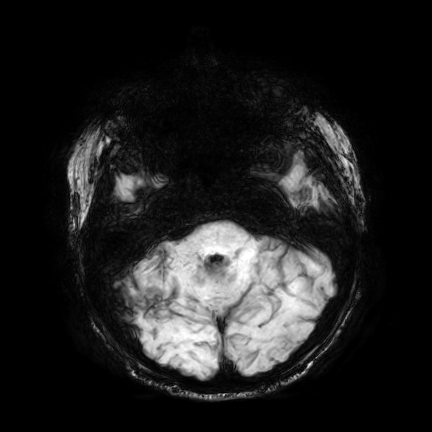
[im 34/80]
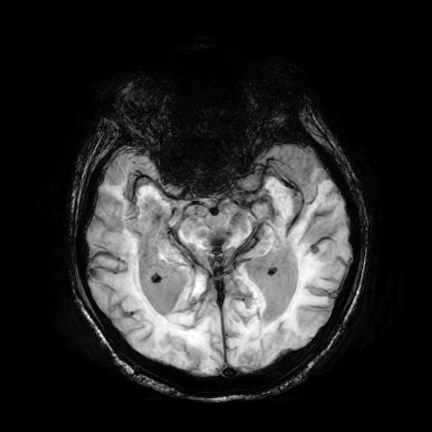
[im 46/80]
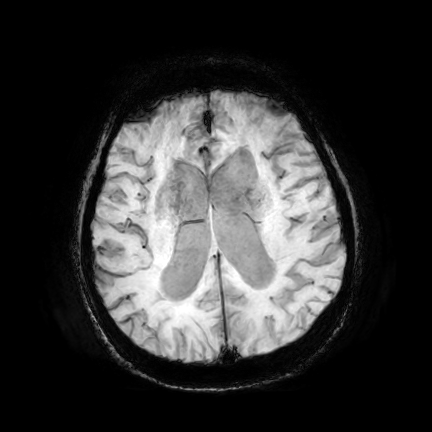
[im 57/80]
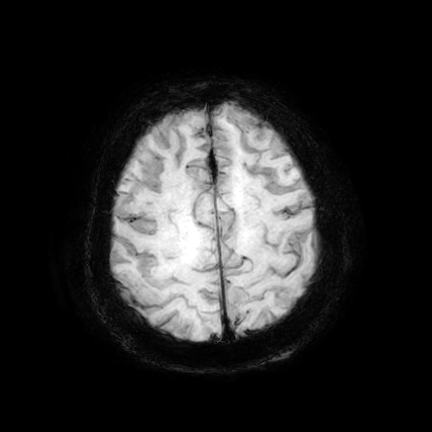
[im 68/80]
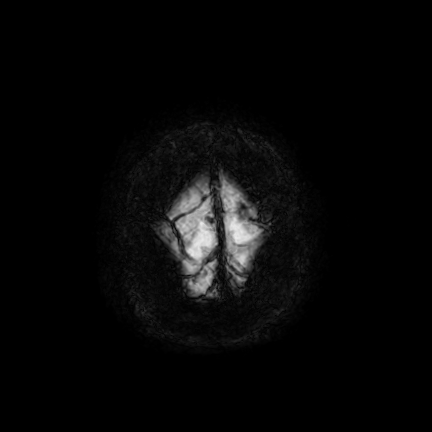
[im 80/80]
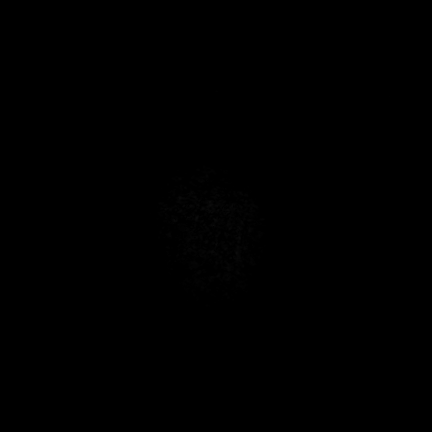

[34 of 48 positions shown; findings below may reference images not displayed]

FINDINGS: -------------------------------------------------------------------------------- 
------------------------- 
INTRACRANIAL: 
Mild progression of nonspecific subcortical, deep white matter, and 
periventricular T-2 FLAIR hyperintensities likely related to chronic 
microangiopathy. No mass or abnormal extra-axial fluid collection. 
No acute ischemia. No abnormal foci of susceptibility artifact in the brain. 
Patency of the intracranial vascular flow voids.  No acute intracranial 
hemorrhage, mass effect, midline shift. No large sellar mass. No hydrocephalus. 
Moderate generalized brain volume loss without lobar specific pattern. 
-------------------------------------------------------------------------------- 
----------------------- 
OTHER: 
ORBITS/SINUSES/T-BONES:  Visualized orbits show no acute abnormality or mass.  
Mastoid air cells and middle ear cavities are grossly clear.  Visualized 
paranasal sinuses are clear. 
MARROW SIGNAL/SOFT TISSUES: No focal suspect signal abnormality.  
-------------------------------------------------------------------------------- 
-------------------
IMPRESSION: No acute intracranial process. 
Mild progression of presumed chronic microangiopathy. 
Moderate generalized brain volume loss without lobar specific pattern. 
If there is clinical concern for Alzheimers disease, consider amyloid PET/CT 
for further assessment.

## 2023-10-18 IMAGING — CT PET VIZAMYL BRAIN SCAN
3 series · 25 of 25 positions shown · non-contrast
Comparison: MRI brain from September 29, 2023.

________________________________________________________________________________________________ 
PET VIZAMYL BRAIN SCAN, 10/18/2023 [DATE]: 
CLINICAL INDICATION: Other Amnesia
TECHNIQUE: A dose of 5.0 mCi of Flutemetamol F-18 was administered intravenously 
and amyloid brain PET scanning was performed at 60 minutes. The patient was left 
to rest prior to imaging of the brain. Low-dose CT was obtained for 
attenuation/correction.

[Series 201: head-low dose ct, idose (3) · axial · 2.0mm · 1.17mm/px · z∈[-31,+147]mm · 9 of 90 slices shown]
[im 1/90]
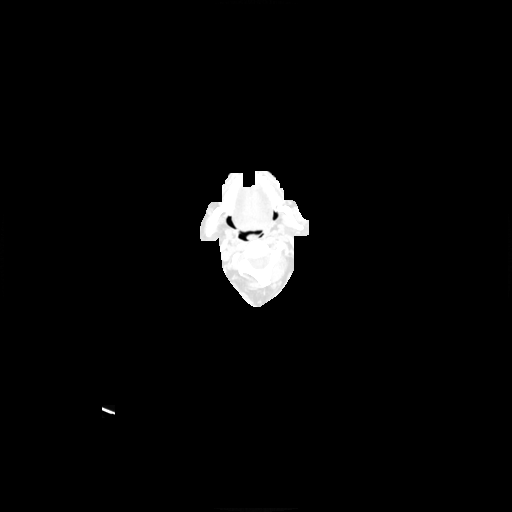
[im 12/90]
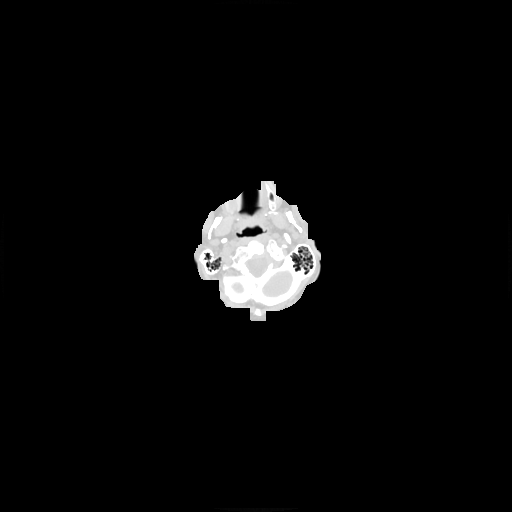
[im 23/90]
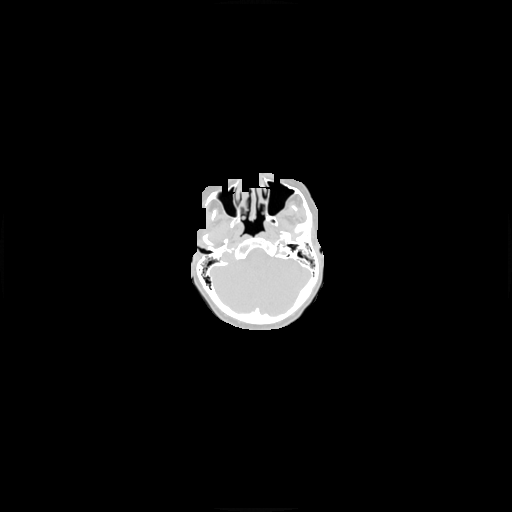
[im 34/90]
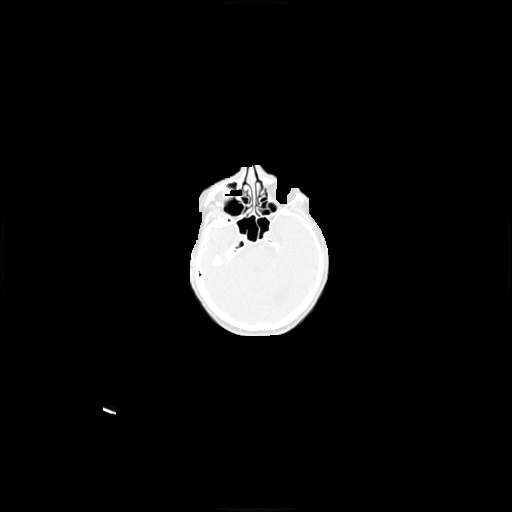
[im 45/90]
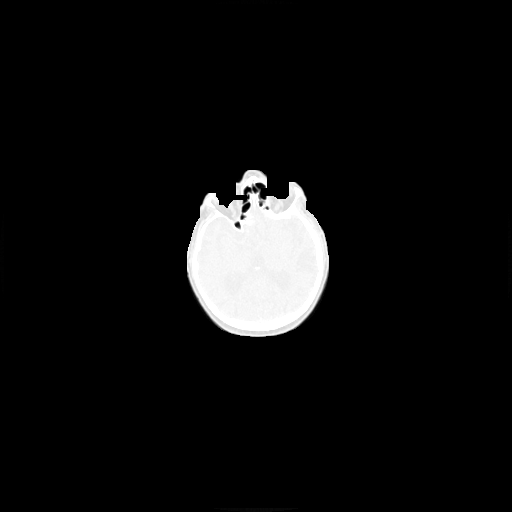
[im 56/90]
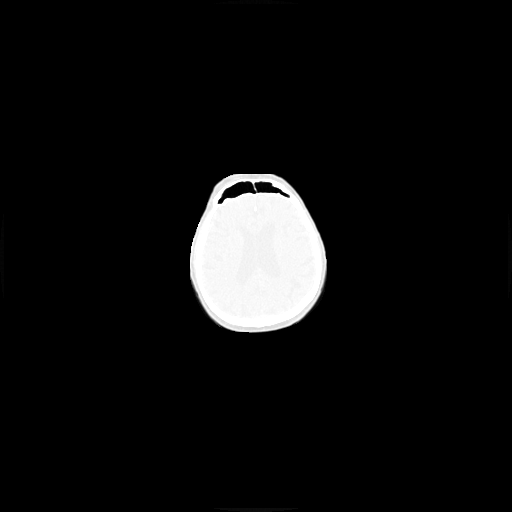
[im 67/90]
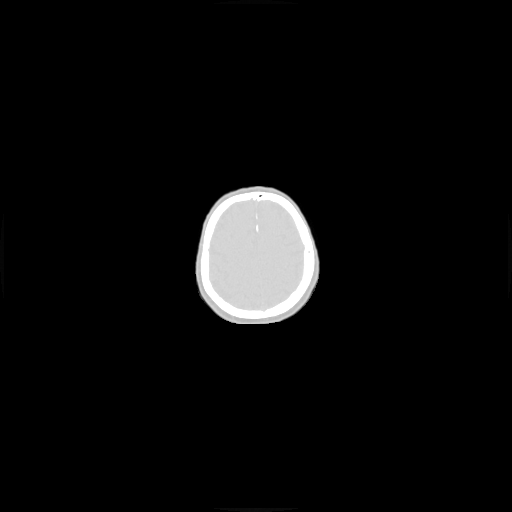
[im 78/90  bone]
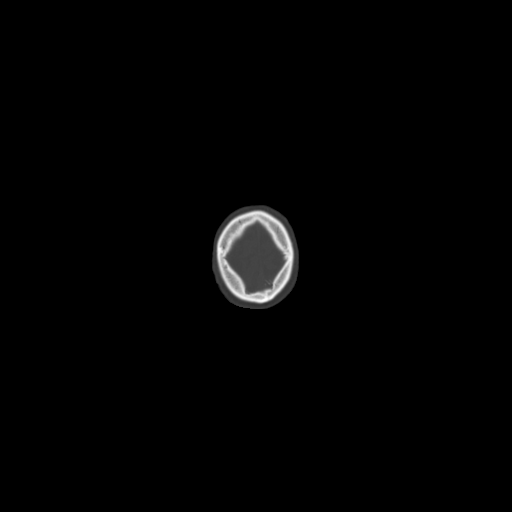
[im 90/90]
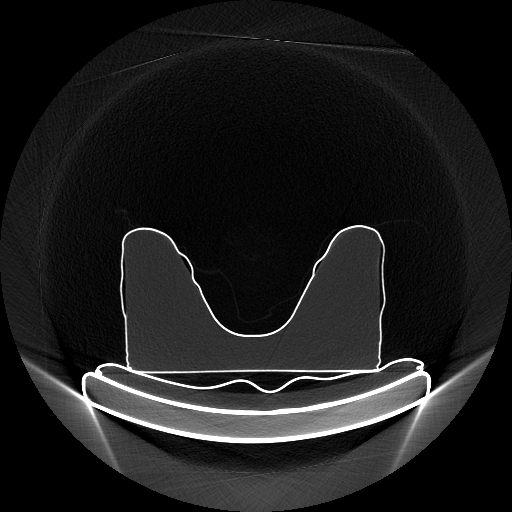

[Series 4444: (br_nac) brain · axial · 2.0mm · 2.00mm/px · z∈[-31,+147]mm · 8 of 90 slices shown]
[im 1/90]
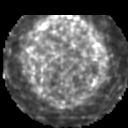
[im 13/90]
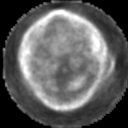
[im 26/90]
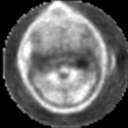
[im 39/90]
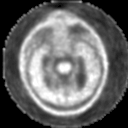
[im 51/90]
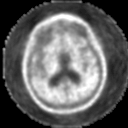
[im 64/90]
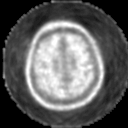
[im 77/90]
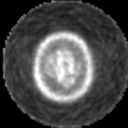
[im 90/90]
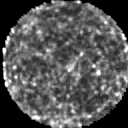

[Series 4446: (br_ctac) brain · axial · 2.0mm · 2.00mm/px · z∈[-31,+147]mm · 8 of 90 slices shown]
[im 1/90]
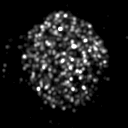
[im 13/90]
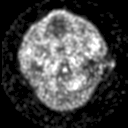
[im 26/90]
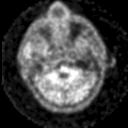
[im 39/90]
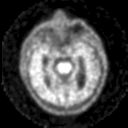
[im 51/90]
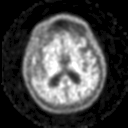
[im 64/90]
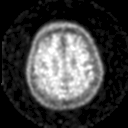
[im 77/90]
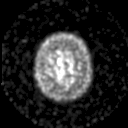
[im 90/90]
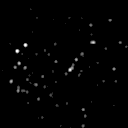

[25 of 25 positions shown; findings below may reference images not displayed]

FINDINGS: --------------------------------------------------------------------------- 
PET: 
FRONTAL LOBES: No increased uptake. 
TEMPORAL LOBES: No increased uptake. 
STRIATUM: No increased uptake. 
POSTERIOR CINGULATE GYRUS and PRECUNEUS: No increased uptake. 
LATERAL PARIETAL LOBES: No increased uptake. 
--------------------------------------------------------------------------- 
CT: 
Brainstem, cerebellum, deep gray nuclei demonstrate no acute abnormality. No 
large acute territorial ischemia. Jumper index measures 0.35 (threshold is 0.30), 
concerning for central volume loss versus normal pressure hydrocephalus.  No 
acute hemorrhage, midline shift, mass effect.   
Visualized paranasal sinuses demonstrate mild mucosal change.  Mastoid air cells 
and middle ear cavities are clear.  Calvarium is intact.  Congenital osseous 
fusion of right occipital condyle but the right C1 arch and the right lateral 
mass of C2. Congenital osseous fusion of left lateral mass of C1 with the 
occipital condyle. 
Visualized parapharyngeal soft tissues and orbits show no focal abnormality. 
---------------------------------------------------------------------------
IMPRESSION: 1.  Negative for significant amyloid deposition. 
2.  Central volume loss versus NPH.
